# Patient Record
Sex: Female | Born: 2000 | Race: White | Hispanic: No | Marital: Single | Smoking: Never smoker
Health system: Southern US, Community
[De-identification: ages and names within clinical notes are randomized; demographics above are authoritative.]

## PROBLEM LIST (undated history)

## (undated) DIAGNOSIS — J353 Hypertrophy of tonsils with hypertrophy of adenoids: Secondary | ICD-10-CM

## (undated) DIAGNOSIS — Z87448 Personal history of other diseases of urinary system: Secondary | ICD-10-CM

## (undated) DIAGNOSIS — K0889 Other specified disorders of teeth and supporting structures: Secondary | ICD-10-CM

## (undated) DIAGNOSIS — K900 Celiac disease: Secondary | ICD-10-CM

## (undated) HISTORY — DX: Celiac disease: K90.0

## (undated) HISTORY — PX: ADENOIDECTOMY: SUR15

## (undated) HISTORY — PX: TYMPANOSTOMY TUBE PLACEMENT: SHX32

## (undated) HISTORY — PX: TONSILLECTOMY: SUR1361

---
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
## 2021-04-20 HISTORY — PX: KNEE SURGERY: SHX244

## 2021-05-07 ENCOUNTER — Other Ambulatory Visit (HOSPITAL_COMMUNITY): Payer: Self-pay

## 2021-05-07 MED ORDER — PROMETHAZINE HCL 25 MG PO TABS
25.0000 mg | ORAL_TABLET | Freq: Four times a day (QID) | ORAL | 0 refills | Status: DC | PRN
  Filled 2021-05-07: qty 30, 8d supply, fill #0

## 2021-05-07 MED ORDER — OXYCODONE HCL 5 MG PO TABS
5.0000 mg | ORAL_TABLET | Freq: Four times a day (QID) | ORAL | 0 refills | Status: AC | PRN
  Filled 2021-05-07: qty 28, 7d supply, fill #0

## 2021-06-18 ENCOUNTER — Other Ambulatory Visit: Payer: Self-pay | Admitting: Orthopedic Surgery

## 2021-06-18 DIAGNOSIS — G8929 Other chronic pain: Secondary | ICD-10-CM

## 2021-06-19 ENCOUNTER — Other Ambulatory Visit: Payer: Self-pay

## 2021-06-19 ENCOUNTER — Ambulatory Visit
Admission: RE | Admit: 2021-06-19 | Discharge: 2021-06-19 | Disposition: A | Discharge status: Home / Self Care | Payer: Federal, State, Local not specified - PPO | Source: Ambulatory Visit | Attending: Orthopedic Surgery | Admitting: Orthopedic Surgery

## 2021-06-19 DIAGNOSIS — M25561 Pain in right knee: Secondary | ICD-10-CM

## 2021-06-19 DIAGNOSIS — G8929 Other chronic pain: Secondary | ICD-10-CM

## 2021-07-09 ENCOUNTER — Other Ambulatory Visit (HOSPITAL_COMMUNITY): Payer: Self-pay

## 2021-07-09 MED ORDER — ONDANSETRON HCL 4 MG PO TABS
4.0000 mg | ORAL_TABLET | Freq: Three times a day (TID) | ORAL | 0 refills | Status: DC | PRN
  Filled 2021-07-09: qty 30, 5d supply, fill #0

## 2021-07-09 MED ORDER — OXYCODONE HCL 5 MG PO TABS
5.0000 mg | ORAL_TABLET | ORAL | 0 refills | Status: DC | PRN
  Filled 2021-07-09: qty 10, 3d supply, fill #0

## 2021-10-20 DIAGNOSIS — M25462 Effusion, left knee: Secondary | ICD-10-CM | POA: Diagnosis not present

## 2021-10-20 DIAGNOSIS — R262 Difficulty in walking, not elsewhere classified: Secondary | ICD-10-CM | POA: Diagnosis not present

## 2021-10-20 DIAGNOSIS — M25662 Stiffness of left knee, not elsewhere classified: Secondary | ICD-10-CM | POA: Diagnosis not present

## 2021-10-20 DIAGNOSIS — M6281 Muscle weakness (generalized): Secondary | ICD-10-CM | POA: Diagnosis not present

## 2021-10-31 DIAGNOSIS — R262 Difficulty in walking, not elsewhere classified: Secondary | ICD-10-CM | POA: Diagnosis not present

## 2021-10-31 DIAGNOSIS — M25462 Effusion, left knee: Secondary | ICD-10-CM | POA: Diagnosis not present

## 2021-10-31 DIAGNOSIS — M25662 Stiffness of left knee, not elsewhere classified: Secondary | ICD-10-CM | POA: Diagnosis not present

## 2021-10-31 DIAGNOSIS — M6281 Muscle weakness (generalized): Secondary | ICD-10-CM | POA: Diagnosis not present

## 2021-11-17 DIAGNOSIS — M25662 Stiffness of left knee, not elsewhere classified: Secondary | ICD-10-CM | POA: Diagnosis not present

## 2021-11-17 DIAGNOSIS — M6281 Muscle weakness (generalized): Secondary | ICD-10-CM | POA: Diagnosis not present

## 2021-11-17 DIAGNOSIS — M25462 Effusion, left knee: Secondary | ICD-10-CM | POA: Diagnosis not present

## 2021-11-17 DIAGNOSIS — R262 Difficulty in walking, not elsewhere classified: Secondary | ICD-10-CM | POA: Diagnosis not present

## 2021-11-28 DIAGNOSIS — M25462 Effusion, left knee: Secondary | ICD-10-CM | POA: Diagnosis not present

## 2021-11-28 DIAGNOSIS — M6281 Muscle weakness (generalized): Secondary | ICD-10-CM | POA: Diagnosis not present

## 2021-11-28 DIAGNOSIS — R262 Difficulty in walking, not elsewhere classified: Secondary | ICD-10-CM | POA: Diagnosis not present

## 2021-11-28 DIAGNOSIS — M25662 Stiffness of left knee, not elsewhere classified: Secondary | ICD-10-CM | POA: Diagnosis not present

## 2021-12-01 DIAGNOSIS — R262 Difficulty in walking, not elsewhere classified: Secondary | ICD-10-CM | POA: Diagnosis not present

## 2021-12-01 DIAGNOSIS — M25662 Stiffness of left knee, not elsewhere classified: Secondary | ICD-10-CM | POA: Diagnosis not present

## 2021-12-01 DIAGNOSIS — M6281 Muscle weakness (generalized): Secondary | ICD-10-CM | POA: Diagnosis not present

## 2021-12-01 DIAGNOSIS — M25462 Effusion, left knee: Secondary | ICD-10-CM | POA: Diagnosis not present

## 2021-12-02 DIAGNOSIS — Z9889 Other specified postprocedural states: Secondary | ICD-10-CM | POA: Diagnosis not present

## 2021-12-05 ENCOUNTER — Other Ambulatory Visit (HOSPITAL_BASED_OUTPATIENT_CLINIC_OR_DEPARTMENT_OTHER): Payer: Self-pay

## 2021-12-05 ENCOUNTER — Other Ambulatory Visit (HOSPITAL_COMMUNITY): Payer: Self-pay

## 2021-12-05 MED ORDER — ALBUTEROL SULFATE HFA 108 (90 BASE) MCG/ACT IN AERS
INHALATION_SPRAY | RESPIRATORY_TRACT | 4 refills | Status: DC
  Filled 2021-12-05: qty 17, 30d supply, fill #0

## 2021-12-05 MED ORDER — ALBUTEROL SULFATE HFA 108 (90 BASE) MCG/ACT IN AERS
INHALATION_SPRAY | RESPIRATORY_TRACT | 2 refills | Status: DC
  Filled 2021-12-05: qty 18, 34d supply, fill #0

## 2022-09-29 ENCOUNTER — Other Ambulatory Visit (HOSPITAL_BASED_OUTPATIENT_CLINIC_OR_DEPARTMENT_OTHER): Payer: Self-pay

## 2022-09-29 MED ORDER — DEXAMETHASONE 2 MG PO TABS
2.0000 mg | ORAL_TABLET | Freq: Three times a day (TID) | ORAL | 0 refills | Status: DC
  Filled 2022-09-29: qty 9, 3d supply, fill #0

## 2022-10-01 ENCOUNTER — Other Ambulatory Visit (HOSPITAL_BASED_OUTPATIENT_CLINIC_OR_DEPARTMENT_OTHER): Payer: Self-pay

## 2022-10-01 DIAGNOSIS — R5383 Other fatigue: Secondary | ICD-10-CM | POA: Diagnosis not present

## 2022-10-01 DIAGNOSIS — Z0001 Encounter for general adult medical examination with abnormal findings: Secondary | ICD-10-CM | POA: Diagnosis not present

## 2022-10-01 DIAGNOSIS — J45991 Cough variant asthma: Secondary | ICD-10-CM | POA: Diagnosis not present

## 2022-10-01 MED ORDER — ALBUTEROL SULFATE HFA 108 (90 BASE) MCG/ACT IN AERS
2.0000 | INHALATION_SPRAY | RESPIRATORY_TRACT | 2 refills | Status: DC
  Filled 2022-10-01: qty 13.4, 30d supply, fill #0

## 2022-10-02 ENCOUNTER — Other Ambulatory Visit (HOSPITAL_BASED_OUTPATIENT_CLINIC_OR_DEPARTMENT_OTHER): Payer: Self-pay

## 2022-10-02 MED ORDER — ONDANSETRON 8 MG PO TBDP
8.0000 mg | ORAL_TABLET | Freq: Three times a day (TID) | ORAL | 0 refills | Status: DC | PRN
  Filled 2022-10-02: qty 4, 2d supply, fill #0

## 2022-10-02 MED ORDER — OXYCODONE HCL 5 MG PO TABS
5.0000 mg | ORAL_TABLET | ORAL | 0 refills | Status: DC
  Filled 2022-10-02: qty 10, 3d supply, fill #0

## 2022-10-04 ENCOUNTER — Other Ambulatory Visit (HOSPITAL_BASED_OUTPATIENT_CLINIC_OR_DEPARTMENT_OTHER): Payer: Self-pay

## 2022-12-10 ENCOUNTER — Other Ambulatory Visit (HOSPITAL_BASED_OUTPATIENT_CLINIC_OR_DEPARTMENT_OTHER): Payer: Self-pay

## 2022-12-10 MED ORDER — ATROVENT HFA 17 MCG/ACT IN AERS
2.0000 | INHALATION_SPRAY | Freq: Two times a day (BID) | RESPIRATORY_TRACT | 1 refills | Status: AC | PRN
  Filled 2022-12-10: qty 12.9, 30d supply, fill #0

## 2023-02-04 DIAGNOSIS — Z0189 Encounter for other specified special examinations: Secondary | ICD-10-CM | POA: Diagnosis not present

## 2023-04-17 ENCOUNTER — Other Ambulatory Visit (HOSPITAL_BASED_OUTPATIENT_CLINIC_OR_DEPARTMENT_OTHER): Payer: Self-pay

## 2023-04-17 DIAGNOSIS — E049 Nontoxic goiter, unspecified: Secondary | ICD-10-CM | POA: Diagnosis not present

## 2023-04-17 DIAGNOSIS — E559 Vitamin D deficiency, unspecified: Secondary | ICD-10-CM | POA: Diagnosis not present

## 2023-04-17 DIAGNOSIS — R5383 Other fatigue: Secondary | ICD-10-CM | POA: Diagnosis not present

## 2023-04-17 DIAGNOSIS — E569 Vitamin deficiency, unspecified: Secondary | ICD-10-CM | POA: Diagnosis not present

## 2023-04-17 DIAGNOSIS — K900 Celiac disease: Secondary | ICD-10-CM | POA: Diagnosis not present

## 2023-04-17 DIAGNOSIS — L700 Acne vulgaris: Secondary | ICD-10-CM | POA: Diagnosis not present

## 2023-04-17 MED ORDER — TRETINOIN 0.025 % EX CREA
1.0000 | TOPICAL_CREAM | Freq: Every evening | CUTANEOUS | 1 refills | Status: AC
  Filled 2023-04-17: qty 20, 30d supply, fill #0

## 2023-04-17 MED ORDER — CLINDAMYCIN PHOS-BENZOYL PEROX 1-5 % EX GEL
1.0000 | Freq: Every day | CUTANEOUS | 2 refills | Status: DC
  Filled 2023-04-17: qty 50, 50d supply, fill #0

## 2023-04-17 MED ORDER — ALBUTEROL SULFATE HFA 108 (90 BASE) MCG/ACT IN AERS
2.0000 | INHALATION_SPRAY | RESPIRATORY_TRACT | 4 refills | Status: DC
  Filled 2023-04-17: qty 13.4, 30d supply, fill #0

## 2023-04-17 MED ORDER — ATROVENT HFA 17 MCG/ACT IN AERS
2.0000 | INHALATION_SPRAY | Freq: Two times a day (BID) | RESPIRATORY_TRACT | 1 refills | Status: AC
  Filled 2023-04-17 – 2023-10-31 (×2): qty 12.9, 30d supply, fill #0

## 2023-04-19 ENCOUNTER — Other Ambulatory Visit: Payer: Self-pay

## 2023-04-19 ENCOUNTER — Other Ambulatory Visit (HOSPITAL_BASED_OUTPATIENT_CLINIC_OR_DEPARTMENT_OTHER): Payer: Self-pay

## 2023-05-24 DIAGNOSIS — R221 Localized swelling, mass and lump, neck: Secondary | ICD-10-CM | POA: Diagnosis not present

## 2023-06-28 IMAGING — MR MR KNEE*R* W/O CM
6 of 9 series · 22 of 40 positions shown · non-contrast
Comparison: None.

CLINICAL DATA: Right knee pain playing basketball 4 months ago.

EXAM:
MRI OF THE RIGHT KNEE WITHOUT CONTRAST
TECHNIQUE: Multiplanar, multisequence MR imaging of the knee was performed. No
intravenous contrast was administered.

[Series 3: STIR · axial · 4.0mm · 0.29mm/px · z∈[-57,-27]mm · 2 of 33 slices shown]
[im 1/33]
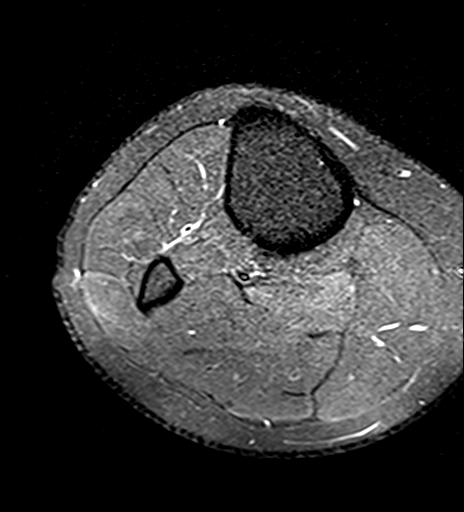
[im 7/33]
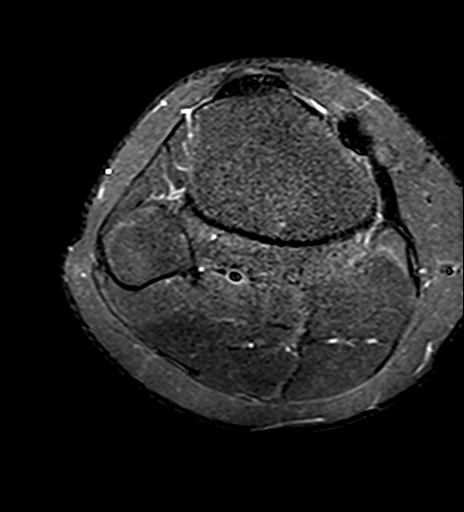

[Series 4: PD · axial · 4.0mm · 0.59mm/px · z∈[-60,+100]mm · 5 of 33 slices shown]
[im 1/33]
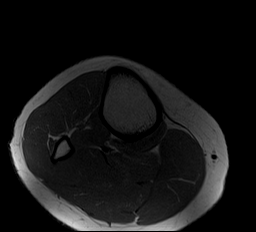
[im 9/33]
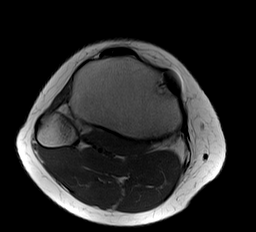
[im 17/33]
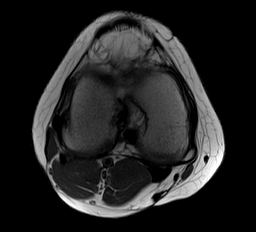
[im 25/33]
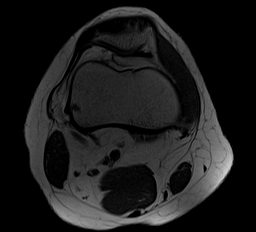
[im 33/33]
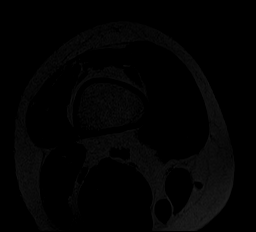

[Series 5: T1 · coronal · 4.0mm · 0.31mm/px · 4 of 24 slices shown]
[im 1/24]
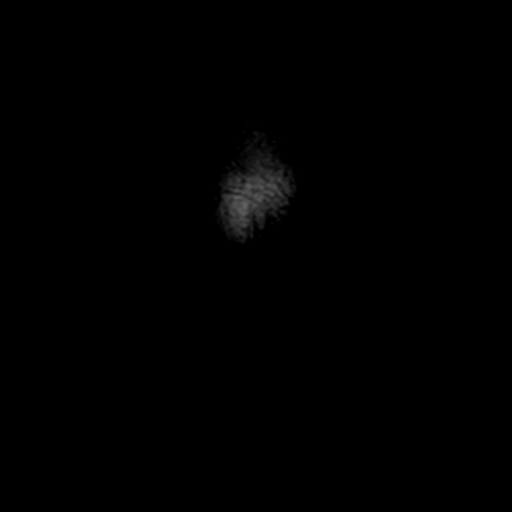
[im 8/24]
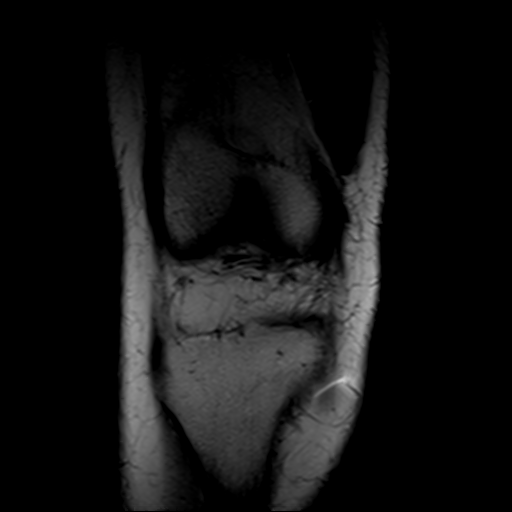
[im 16/24]
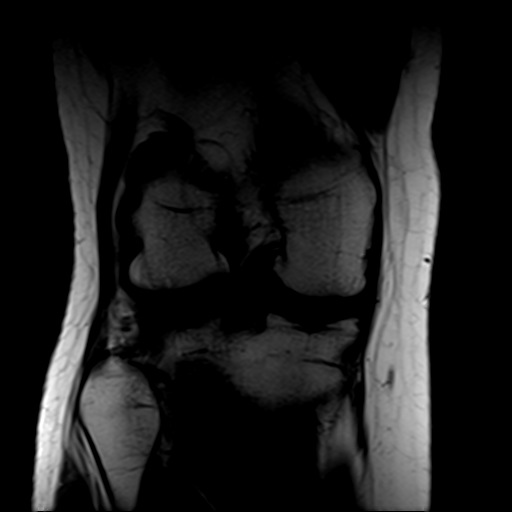
[im 24/24]
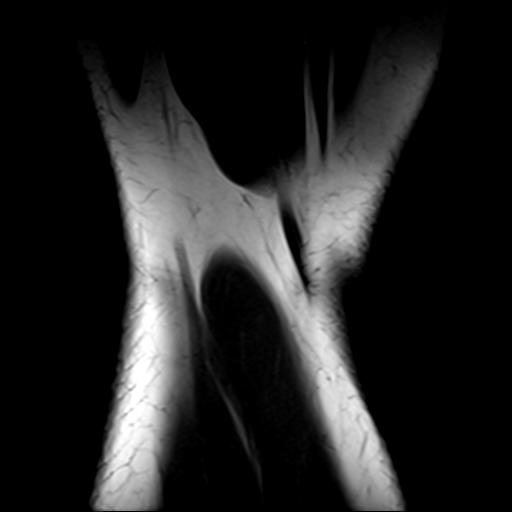

[Series 6: PD fat-sat · coronal · 4.0mm · 0.31mm/px · 4 of 24 slices shown (1 of 3)]
[im 1/24]
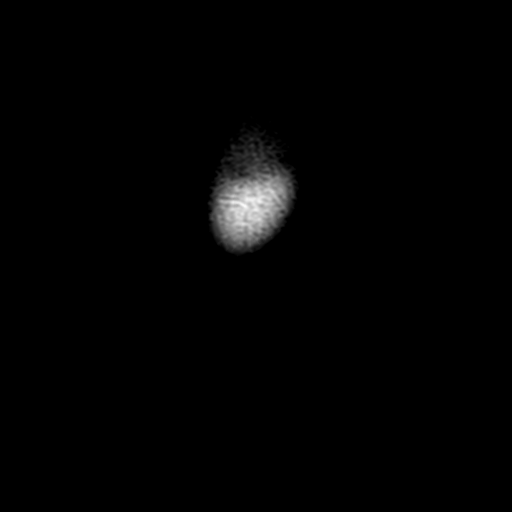
[im 8/24]
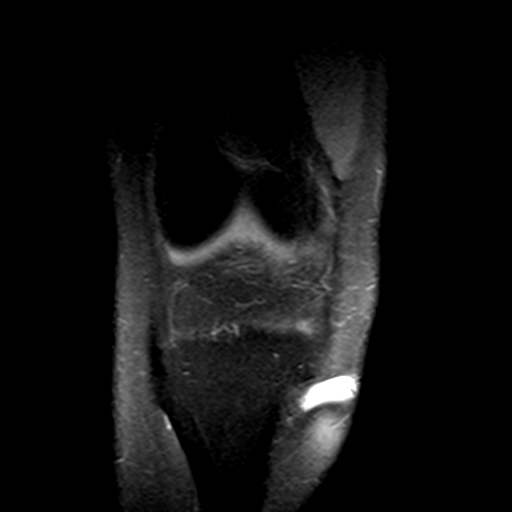
[im 16/24]
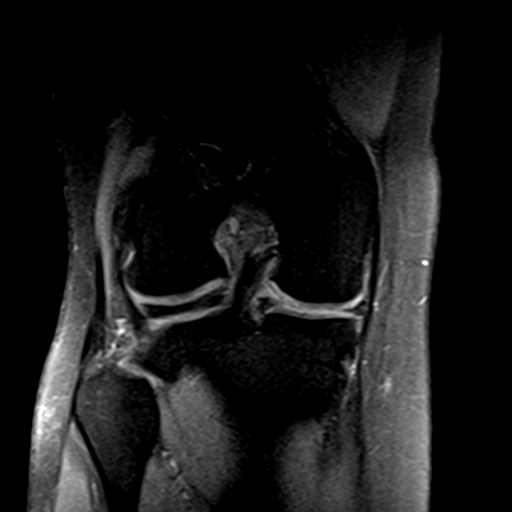
[im 24/24]
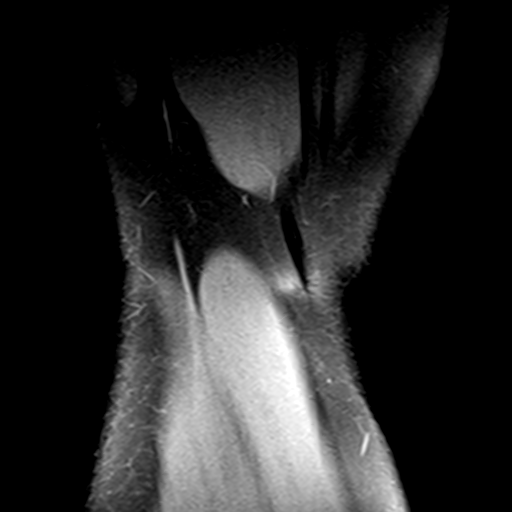

[Series 8: PD fat-sat · sagittal · 3.0mm · 0.31mm/px · 5 of 31 slices shown (2 of 3)]
[im 1/31]
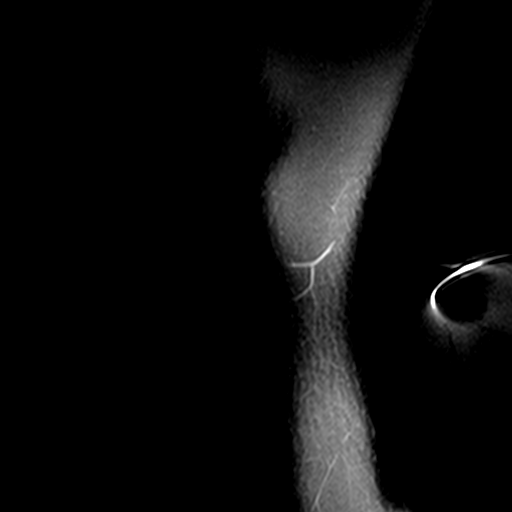
[im 8/31]
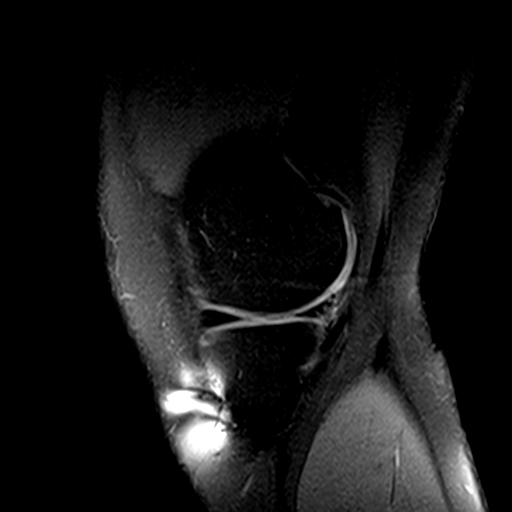
[im 16/31]
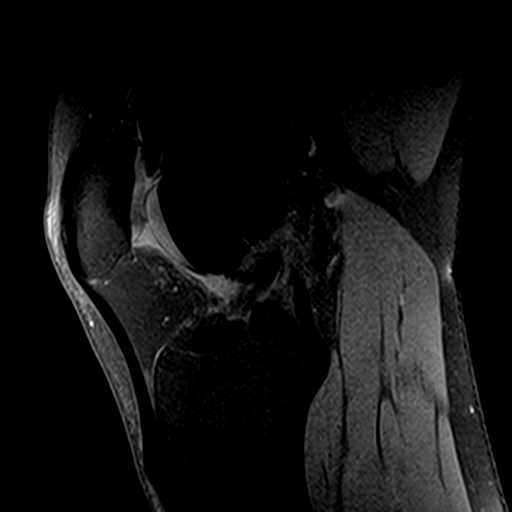
[im 23/31]
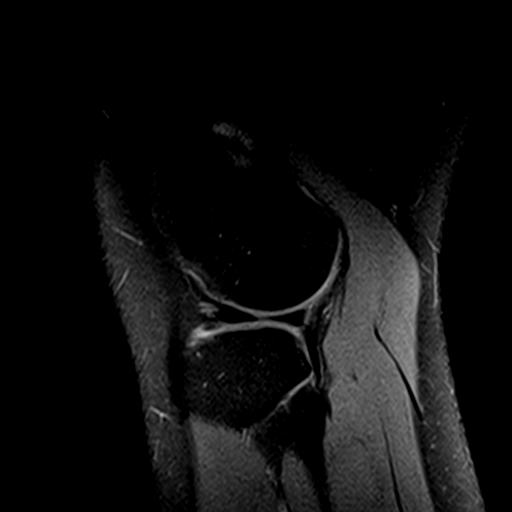
[im 31/31]
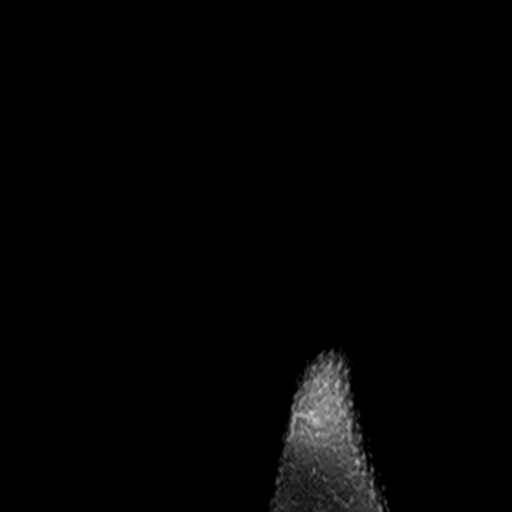

[Series 11: PD fat-sat · oblique · 2.3mm · 0.29mm/px · 2 of 13 slices shown (3 of 3)]
[im 1/13]
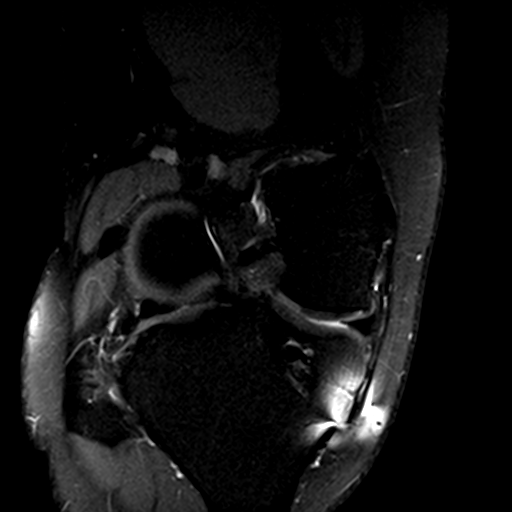
[im 13/13]
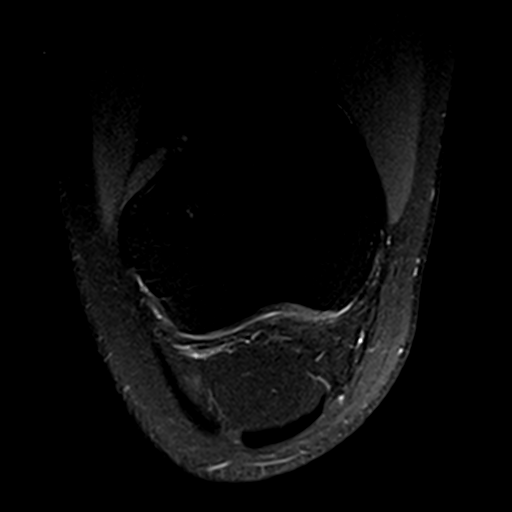

[22 of 40 positions shown; findings below may reference images not displayed]

FINDINGS: MENISCI

Medial: Oblique linear signal abnormality in the posterior horn of
the medial meniscus which may reflect postsurgical changes from
prior meniscal repair versus a tear. Attenuation of the body of the
medial meniscus likely reflecting prior partial meniscectomy.

Lateral: Oblique tear of the posterior horn-body junction of the
lateral meniscus.

LIGAMENTS

Cruciates: Prior ACL repair with the ACL graft intact.  Intact PCL.

Collaterals: Medial collateral ligament is intact. Lateral
collateral ligament complex is intact.

CARTILAGE

Patellofemoral:  Mild cartilage fissuring of the patellar apex.

Medial: Mild partial-thickness cartilage loss of the medial
femorotibial compartment.

Lateral:  No chondral defect.

JOINT: No joint effusion. Normal Suvituulia Skriko. No plical
thickening.

POPLITEAL FOSSA: Popliteus tendon is intact. No Baker's cyst.

EXTENSOR MECHANISM: Intact quadriceps tendon. Intact patellar
tendon. Intact lateral patellar retinaculum. Intact medial patellar
retinaculum. Intact MPFL.

BONES: No aggressive osseous lesion. No fracture or dislocation.

Other: No fluid collection or hematoma. Muscles are normal.
IMPRESSION: 1. Oblique linear signal abnormality in the posterior horn of the
medial meniscus which may reflect postsurgical changes from prior
meniscal repair versus a tear. Attenuation of the body of the medial
meniscus likely reflecting prior partial meniscectomy.
2. Oblique tear of the posterior horn-body junction of the lateral
meniscus.
3. Prior ACL repair with the ACL graft intact.

## 2023-07-01 ENCOUNTER — Encounter: Payer: Self-pay | Admitting: Gastroenterology

## 2023-08-02 ENCOUNTER — Other Ambulatory Visit (HOSPITAL_BASED_OUTPATIENT_CLINIC_OR_DEPARTMENT_OTHER): Payer: Self-pay

## 2023-08-31 DIAGNOSIS — Z124 Encounter for screening for malignant neoplasm of cervix: Secondary | ICD-10-CM | POA: Diagnosis not present

## 2023-08-31 DIAGNOSIS — R221 Localized swelling, mass and lump, neck: Secondary | ICD-10-CM | POA: Diagnosis not present

## 2023-08-31 DIAGNOSIS — J383 Other diseases of vocal cords: Secondary | ICD-10-CM | POA: Diagnosis not present

## 2023-08-31 DIAGNOSIS — M26622 Arthralgia of left temporomandibular joint: Secondary | ICD-10-CM | POA: Diagnosis not present

## 2023-08-31 DIAGNOSIS — Z113 Encounter for screening for infections with a predominantly sexual mode of transmission: Secondary | ICD-10-CM | POA: Diagnosis not present

## 2023-08-31 DIAGNOSIS — Z01419 Encounter for gynecological examination (general) (routine) without abnormal findings: Secondary | ICD-10-CM | POA: Diagnosis not present

## 2023-09-01 ENCOUNTER — Encounter: Payer: Self-pay | Admitting: Gastroenterology

## 2023-09-01 ENCOUNTER — Ambulatory Visit: Payer: PRIVATE HEALTH INSURANCE | Admitting: Gastroenterology

## 2023-09-01 ENCOUNTER — Other Ambulatory Visit (INDEPENDENT_AMBULATORY_CARE_PROVIDER_SITE_OTHER)

## 2023-09-01 ENCOUNTER — Other Ambulatory Visit (HOSPITAL_BASED_OUTPATIENT_CLINIC_OR_DEPARTMENT_OTHER): Payer: Self-pay

## 2023-09-01 VITALS — BP 92/58 | HR 79 | Ht 67.0 in | Wt 153.0 lb

## 2023-09-01 DIAGNOSIS — K8681 Exocrine pancreatic insufficiency: Secondary | ICD-10-CM

## 2023-09-01 DIAGNOSIS — K900 Celiac disease: Secondary | ICD-10-CM | POA: Insufficient documentation

## 2023-09-01 DIAGNOSIS — R09A2 Foreign body sensation, throat: Secondary | ICD-10-CM

## 2023-09-01 LAB — IBC + FERRITIN
Ferritin: 24.1 ng/mL (ref 10.0–291.0)
Iron: 76 ug/dL (ref 42–145)
Saturation Ratios: 18 % — ABNORMAL LOW (ref 20.0–50.0)
TIBC: 422.8 ug/dL (ref 250.0–450.0)
Transferrin: 302 mg/dL (ref 212.0–360.0)

## 2023-09-01 LAB — B12 AND FOLATE PANEL
Folate: >25.2 ng/mL (ref >5.9)
Vitamin B-12: 271 pg/mL (ref 211–911)

## 2023-09-01 LAB — VITAMIN D 25 HYDROXY (VIT D DEFICIENCY, FRACTURES): VITD: 50.23 ng/mL (ref 30.00–100.00)

## 2023-09-01 MED ORDER — FAMOTIDINE 20 MG PO TABS
20.0000 mg | ORAL_TABLET | Freq: Two times a day (BID) | ORAL | 3 refills | Status: AC
  Filled 2023-09-01: qty 180, 90d supply, fill #0

## 2023-09-01 NOTE — Progress Notes (Signed)
 09/01/2023 Marie Odom [id number] 10-Dec-2000   HISTORY OF PRESENT ILLNESS: This is a pleasant 23 year old female who is new to our office.  She is here today with her mom to establish adult GI care.  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  She will still be under her parents' insurance so they wanted to her to have her care locally.  She started seeing GI around 2018 at which time she was diagnosed with celiac disease.  She started out at [facility] and then they eventually got a second opinion at [facility].  She has had 3 EGDs since 2018, looks like 2018, 2019, 2021.  Celiac labs were abnormal, but her IgA levels were low as well.  It looks like her initial duodenal biopsies in 2018 showed some villous blunting.  She has been very strict with her gluten-free diet.  She has had lifelong complaints of stomach issues, which all along were likely due to celiac disease.  Still has some pain, occasional diarrhea, and nausea on and off, but not nearly to the degree as it had been in the past.  Never had a colonoscopy.  Does also report globus sensation that has been going on for a while, years.  She denies any overt heartburn or reflux.  She does have a lot of environmental allergies.  She is going to have a thyroid  ultrasound just to check that as well.  No dysphagia.   Past Medical History:  Diagnosis Date   Celiac disease    History of renal failure 05/21/2013   secondary to medication   Tonsillar and adenoid hypertrophy 10/18/2013   occasionally snores during sleep, mother denies apnea   Tooth loose 11/06/2013   left upper   Past Surgical History:  Procedure Laterality Date   ADENOIDECTOMY     KNEE SURGERY Right 2023   ACL   KNEE SURGERY Left 2019   RENAL BIOPSY  06/12/2013   TONSILLECTOMY     TONSILLECTOMY AND ADENOIDECTOMY Bilateral 11/15/2013   Procedure: BILATERAL TONSILLECTOMY AND ADENOIDECTOMY;  Surgeon:  Ammon Bales, MD;  Location: [facility];  Service: ENT;  Laterality: Bilateral;   TYMPANOSTOMY TUBE PLACEMENT      reports that she has never smoked. She has never used smokeless tobacco. She reports current alcohol use. She reports that she does not use drugs. family history includes Anesthesia problems in her mother; Asthma in her brother; Heart disease (age of onset: 63) in her maternal grandfather. Allergies  Allergen Reactions   Gluten Meal Other (See Comments)    Dx of celiac disease, GI upset   Ibuprofen Other (See Comments)    ACUTE RENAL FAILURE   Penicillins Other (See Comments)    ACUTE RENAL FAILURE      Outpatient Encounter Medications as of 09/01/2023  Medication Sig   albuterol  (VENTOLIN  HFA) 108 (90 Base) MCG/ACT inhaler Inhale 2 puffs into the lungs every 4-6 hours as needed.   cetirizine (ZYRTEC) 10 MG tablet Take 10 mg by mouth daily.   fluticasone  (FLONASE  ALLERGY RELIEF) 50 MCG/ACT nasal spray Place 1 spray into both nostrils daily as needed   ipratropium (ATROVENT  HFA) 17 MCG/ACT inhaler Inhale 2 puffs into the lungs 1 (one) to 2 (two) times daily as needed.   ipratropium (ATROVENT  HFA) 17 MCG/ACT inhaler Inhale 2 puffs into the lungs 2 (two) times daily. (Patient taking differently: Inhale 2 puffs into the lungs as needed.)  tretinoin  (RETIN-A ) 0.025 % cream 1 Applicatorful applied to skin every evening   albuterol  (VENTOLIN  HFA) 108 (90 Base) MCG/ACT inhaler INHALE 2 PUFFS BY MOUTH EVERY 4 TO 6 HOURS AS NEEDED   oxyCODONE  (OXY IR/ROXICODONE ) 5 MG immediate release tablet Take 1-2 tablets (5-10 mg total) by mouth every 6 (six) hours as needed for up to 5 days   [DISCONTINUED] albuterol  (VENTOLIN  HFA) 108 (90 Base) MCG/ACT inhaler Inhale 2 Puffs every 4 to 6 hours as needed   [DISCONTINUED] albuterol  (VENTOLIN  HFA) 108 (90 Base) MCG/ACT inhaler 2 Puffs by metered dose inhaler every 4 to 6 hours as needed for cough/wheeze   [DISCONTINUED] albuterol   (VENTOLIN  HFA) 108 (90 Base) MCG/ACT inhaler Inhale 2 puffs into the lungs every 4 to 6 hours as needed for cough/wheeze.   [DISCONTINUED] albuterol  (VENTOLIN  HFA) 108 (90 Base) MCG/ACT inhaler Inhale 2 puffs into the lungs every 4-6 hours as needed   [DISCONTINUED] clindamycin -benzoyl peroxide  (BENZACLIN) gel Apply 1 Application topically daily to skin.   [DISCONTINUED] dexamethasone  (DECADRON ) 2 MG tablet Take 1 tablet (2 mg total) by mouth every 8 (eight) hours   [DISCONTINUED] famotidine  (PEPCID ) 40 MG tablet Take 1 tablet (40 mg total) by mouth daily as needed   [DISCONTINUED] HYDROcodone -acetaminophen  (HYCET) 7.5-325 mg/15 ml solution Take 5 mLs by mouth every 4 (four) hours as needed for moderate pain.   [DISCONTINUED] ondansetron  (ZOFRAN ) 4 MG tablet Take 1-2 tablets (4-8 mg total) by mouth every 8 (eight) hours as needed for nausea/vomiting   [DISCONTINUED] ondansetron  (ZOFRAN -ODT) 8 MG disintegrating tablet Take 1 tablet (8 mg total) by mouth every 8 (eight) hours as needed for nausea.   [DISCONTINUED] oxyCODONE  (OXY IR/ROXICODONE ) 5 MG immediate release tablet Take 1 tablet (5 mg total) by mouth every 4 (four) hours as needed. This is for breakthrough pain after surgery.  Please take as needed only   [DISCONTINUED] oxyCODONE  (OXY IR/ROXICODONE ) 5 MG immediate release tablet Take 1 tablet (5 mg total) by mouth every 4-6 hours as needed for severe pain.   [DISCONTINUED] polyethylene glycol powder (GLYCOLAX /MIRALAX ) powder Take 1 Container by mouth daily. Take 1 container daily as long as she is taking the pain medication.   [DISCONTINUED] promethazine  (PHENERGAN ) 25 MG tablet Take 1 tablet (25 mg total) by mouth every 6 (six) hours as needed for nausea (Patient not taking: Reported on 09/01/2023)   No facility-administered encounter medications on file as of 09/01/2023.     REVIEW OF SYSTEMS  : All other systems reviewed and negative except where noted in the History of Present  Illness.   PHYSICAL EXAM: BP (!) 80/68   Pulse 79   Ht 5\' 7"  (1.702 m)   Wt 153 lb (69.4 kg)   LMP 08/18/2023 (Approximate)   SpO2 98%   BMI 23.96 kg/m  General: Well developed white female in no acute distress Head: Normocephalic and atraumatic Eyes:  Sclerae anicteric, conjunctiva pink. Ears: Normal auditory acuity Lungs: Clear throughout to auscultation; no W/R/R. Heart: Regular rate and rhythm; no M/R/G. Abdomen: Soft, non-distended.  BS present.  Non-tender. Musculoskeletal: Symmetrical with no gross deformities  Skin: No lesions on visible extremities Extremities: No edema  Neurological: Alert oriented x 4, grossly non-focal Psychological:  Alert and cooperative. Normal mood and affect  ASSESSMENT AND PLAN: *Celiac disease: Diagnosed in 2018.  Very strict about her diet.  Wanting to establish care with adult GI.  Will plan to check celiac titers, iron studies, B12 and folate levels, vitamin D levels, zinc  and copper levels, and vitamin B6 levels. *Globus sensation: Denies overt heartburn and reflux.  Has a lot of allergies, environmental allergy issues.  She is going to be having a thyroid  ultrasound just to check that.  Suggested maybe she try famotidine  20 mg twice daily for a while to see if that helps as it could help if it is reflux and/or if its allergy related.  Prescription sent to pharmacy.  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   CC:  Awanda Lennert, MD

## 2023-09-01 NOTE — Patient Instructions (Addendum)
 Your provider has requested that you go to the basement level for lab work before leaving today. Press "B" on the elevator. The lab is located at the first door on the left as you exit the elevator.  We have sent the following medications to your pharmacy for you to pick up at your convenience: Famotidine  20 mg twice daily.   _______________________________________________________  If your blood pressure at your visit was 140/90 or greater, please contact your primary care physician to follow up on this.  _______________________________________________________  If you are age 67 or older, your body mass index should be between 23-30. Your Body mass index is 23.96 kg/m. If this is out of the aforementioned range listed, please consider follow up with your Primary Care Provider.  If you are age 84 or younger, your body mass index should be between 19-25. Your Body mass index is 23.96 kg/m. If this is out of the aformentioned range listed, please consider follow up with your Primary Care Provider.   ________________________________________________________  The [facility] providers would like to encourage you to use MYCHART to communicate with providers for non-urgent requests or questions.  Due to long hold times on the telephone, sending your provider a message by [facility] may be a faster and more efficient way to get a response.  Please allow 48 business hours for a response.  Please remember that this is for non-urgent requests.  _______________________________________________________

## 2023-09-02 ENCOUNTER — Encounter (HOSPITAL_BASED_OUTPATIENT_CLINIC_OR_DEPARTMENT_OTHER): Payer: Self-pay | Admitting: Pulmonary Disease

## 2023-09-02 ENCOUNTER — Ambulatory Visit (HOSPITAL_BASED_OUTPATIENT_CLINIC_OR_DEPARTMENT_OTHER): Payer: PRIVATE HEALTH INSURANCE | Admitting: Pulmonary Disease

## 2023-09-02 VITALS — BP 100/67 | HR 67 | Ht 67.0 in | Wt 153.0 lb

## 2023-09-02 DIAGNOSIS — J302 Other seasonal allergic rhinitis: Secondary | ICD-10-CM

## 2023-09-02 DIAGNOSIS — J383 Other diseases of vocal cords: Secondary | ICD-10-CM

## 2023-09-02 NOTE — Patient Instructions (Signed)
 YOUR PLAN:  -VOCAL CORD DYSFUNCTION: Vocal cord dysfunction is a condition where the vocal cords do not open correctly, causing breathing difficulties during activities like exercise. Your symptoms are consistent with this condition, and previous treatments with inhalers have not been effective. We recommend trying biofeedback techniques, such as abdominal breathing, to help manage your symptoms. We also provided reading material on vocal cord dysfunction and advised against relying on inhalers for this condition. If you wish, we can refer you to Dr. Kimberly Penna, an ENT specialist at [facility], for further evaluation. If your symptoms worsen, consider spirometry post-exercise.

## 2023-09-02 NOTE — Progress Notes (Signed)
 Subjective:    Patient ID: Marie Odom, female    DOB: 2000/08/08, 23 y.o.   MRN: [id number]  HPI  Marie Odom is a 23 year old female with vocal cord dysfunction who presents with breathing difficulties during exercise. She is accompanied by her mother. She was referred for evaluation of vocal cord dysfunction.  She experiences inspiratory difficulty during high-intensity activities, which resolves after a few minutes of rest. These episodes have persisted despite using inhalers like albuterol  and Atrovent . Previous spirometry was normal. A pulmonologist suggested vocal cord dysfunction, and she has seen a speech therapist, though the focus was on her voice rather than breathing techniques.  Her symptoms are less frequent with improved cardiovascular fitness. She transitioned from basketball to hiking but still experiences similar episodes. On non-strenuous days, she does not have breathing difficulties.  She has allergies to grass and environmental triggers, which worsen her symptoms. She takes Zyrtec year-round and occasionally uses Flonase . Strong scents cause discomfort. Her symptoms began around 2017, confirmed by allergy testing showing significant reactions. Thyroid  testing planned by ENT  CXR 10/2019 clear  Significant tests/ events reviewed  Spirometry 10/2017 ratio 93 FEV1 96%, FVC 91%  Past Medical History:  Diagnosis Date   Celiac disease    History of renal failure 05/21/2013   secondary to medication   Tonsillar and adenoid hypertrophy 10/18/2013   occasionally snores during sleep, mother denies apnea   Tooth loose 11/06/2013   left upper   Past Surgical History:  Procedure Laterality Date   ADENOIDECTOMY     KNEE SURGERY Right 2023   ACL   KNEE SURGERY Left 2019   RENAL BIOPSY  06/12/2013   TONSILLECTOMY     TONSILLECTOMY AND ADENOIDECTOMY Bilateral 11/15/2013   Procedure: BILATERAL TONSILLECTOMY AND ADENOIDECTOMY;  Surgeon: Ammon Bales, MD;  Location:  [facility];  Service: ENT;  Laterality: Bilateral;   TYMPANOSTOMY TUBE PLACEMENT     Allergies  Allergen Reactions   Gluten Meal Other (See Comments)    Dx of celiac disease, GI upset   Ibuprofen Other (See Comments)    ACUTE RENAL FAILURE   Penicillins Other (See Comments)    ACUTE RENAL FAILURE    Social History   Socioeconomic History   Marital status: Single    Spouse name: Not on file   Number of children: Not on file   Years of education: Not on file   Highest education level: Not on file  Occupational History   Not on file  Tobacco Use   Smoking status: Never   Smokeless tobacco: Never  Vaping Use   Vaping status: Never Used  Substance and Sexual Activity   Alcohol use: Yes    Comment: occ   Drug use: No   Sexual activity: Not on file  Other Topics Concern   Not on file  Social History Narrative   Lives with Mom, Dad and 2 brothers   Social Drivers of Corporate investment banker Strain: Not on file  Food Insecurity: Not on file  Transportation Needs: Not on file  Physical Activity: Not on file  Stress: Not on file  Social Connections: Not on file  Intimate Partner Violence: Not on file    Family History  Problem Relation Age of Onset   Anesthesia problems Mother        post-op nausea   Asthma Brother    Heart disease Maternal Grandfather 57       MI   Colon cancer  Neg Hx    Esophageal cancer Neg Hx    Stomach cancer Neg Hx      Review of Systems Constitutional: negative for anorexia, fevers and sweats  Eyes: negative for irritation, redness and visual disturbance  Ears, nose, mouth, throat, and face: negative for earaches, epistaxis, nasal congestion and sore throat  Respiratory: negative for cough, dyspnea on exertion, sputum and wheezing  Cardiovascular: negative for chest pain, dyspnea, lower extremity edema, orthopnea, palpitations and syncope  Gastrointestinal: negative for abdominal pain, constipation, diarrhea, melena,  nausea and vomiting  Genitourinary:negative for dysuria, frequency and hematuria  Hematologic/lymphatic: negative for bleeding, easy bruising and lymphadenopathy  Musculoskeletal:negative for arthralgias, muscle weakness and stiff joints  Neurological: negative for coordination problems, gait problems, headaches and weakness  Endocrine: negative for diabetic symptoms including polydipsia, polyuria and weight loss     Objective:   Physical Exam  Gen. Pleasant, well-nourished, in no distress, normal affect ENT - no pallor,icterus, no post nasal drip Neck: No JVD, no thyromegaly, no carotid bruits Lungs: no use of accessory muscles, no dullness to percussion, clear without rales or rhonchi  Cardiovascular: Rhythm regular, heart sounds  normal, no murmurs or gallops, no peripheral edema Abdomen: soft and non-tender, no hepatosplenomegaly, BS normal. Musculoskeletal: No deformities, no cyanosis or clubbing Neuro:  alert, non focal       Assessment & Plan:    Vocal cord dysfunction Chronic vocal cord dysfunction with symptoms primarily during peak exercise, such as basketball and hiking, consistent with classic presentation. Spirometry in 2019 was normal. No evidence of asthma as symptoms do not occur with lesser exercise. Previous treatments with inhalers, including albuterol  and Atrovent , were ineffective. Speech therapy was not beneficial. No anatomical abnormalities expected on ENT examination. Condition is self-limiting and may improve with time. Biofeedback techniques, such as abdominal breathing, may help manage symptoms. No anxiety component noted. Knowledge of the condition and its self-limiting nature is crucial for management. - Provide reading material on vocal cord dysfunction. - Advise against reliance on inhalers for this condition. - Recommend biofeedback techniques, specifically abdominal breathing, to manage symptoms. - Offer referral to Dr. Kimberly Penna, ENT at [facility],  for further evaluation if desired. - Consider spirometry post-exercise if symptoms worsen.  Allergic rhinitis Chronic allergic rhinitis with significant sensitivity to grass and other allergens. Symptoms include reactions to outdoor allergens and strong scents. Managed with Zyrtec year-round and occasional Flonase  use. No indication that allergies contribute to vocal cord dysfunction.  Clearance for school work provided, appears free of communicable TB

## 2023-09-02 NOTE — Addendum Note (Signed)
 Addended by: Cain Castillo on: 09/02/2023 04:16 PM   Modules accepted: Orders

## 2023-09-03 ENCOUNTER — Other Ambulatory Visit

## 2023-09-03 ENCOUNTER — Telehealth: Payer: Self-pay | Admitting: *Deleted

## 2023-09-03 DIAGNOSIS — R09A2 Foreign body sensation, throat: Secondary | ICD-10-CM

## 2023-09-03 DIAGNOSIS — K900 Celiac disease: Secondary | ICD-10-CM

## 2023-09-03 NOTE — Progress Notes (Signed)
 ____________________________________________________________  Attending physician addendum:  Thank you for sending this case to me. I have reviewed the entire note and agree with the plan.  Looks like last seen [facility] in 2020 - they felt confident of the Dx based on duodenal Bx results,  Lorella Roles, MD  ____________________________________________________________

## 2023-09-03 NOTE — Telephone Encounter (Signed)
-----   Message from Weston D. Zehr sent at 09/02/2023  4:48 PM EDT ----- Patient to come back for serum copper level.

## 2023-09-03 NOTE — Telephone Encounter (Signed)
 Spoke with patient she will stop by the for the blood draw

## 2023-09-05 LAB — ZINC: Zinc: 69 ug/dL (ref 60–130)

## 2023-09-05 LAB — TISSUE TRANSGLUTAMINASE ABS,IGG,IGA
(tTG) Ab, IgA: 2.8 U/mL
(tTG) Ab, IgG: <1 U/mL

## 2023-09-05 LAB — VITAMIN B6: Vitamin B6: 30.7 ng/mL — ABNORMAL HIGH (ref 2.1–21.7)

## 2023-09-05 LAB — IGA: Immunoglobulin A: 101 mg/dL (ref 47–310)

## 2023-09-05 LAB — CERULOPLASMIN: Ceruloplasmin: 21 mg/dL (ref 14–48)

## 2023-09-07 ENCOUNTER — Ambulatory Visit: Payer: Self-pay | Admitting: Gastroenterology

## 2023-09-07 LAB — COPPER, SERUM: Copper: 103 ug/dL (ref 70–175)

## 2023-09-20 DIAGNOSIS — R221 Localized swelling, mass and lump, neck: Secondary | ICD-10-CM | POA: Diagnosis not present

## 2023-10-28 DIAGNOSIS — D2262 Melanocytic nevi of left upper limb, including shoulder: Secondary | ICD-10-CM | POA: Diagnosis not present

## 2023-10-28 DIAGNOSIS — L700 Acne vulgaris: Secondary | ICD-10-CM | POA: Diagnosis not present

## 2023-10-28 DIAGNOSIS — L858 Other specified epidermal thickening: Secondary | ICD-10-CM | POA: Diagnosis not present

## 2023-10-28 DIAGNOSIS — L821 Other seborrheic keratosis: Secondary | ICD-10-CM | POA: Diagnosis not present

## 2023-10-29 ENCOUNTER — Other Ambulatory Visit (HOSPITAL_BASED_OUTPATIENT_CLINIC_OR_DEPARTMENT_OTHER): Payer: Self-pay

## 2023-10-29 MED ORDER — TRETINOIN 0.025 % EX CREA
TOPICAL_CREAM | CUTANEOUS | 11 refills | Status: AC
  Filled 2023-10-29: qty 20, 30d supply, fill #0

## 2023-11-01 ENCOUNTER — Other Ambulatory Visit (HOSPITAL_BASED_OUTPATIENT_CLINIC_OR_DEPARTMENT_OTHER): Payer: Self-pay

## 2024-04-11 ENCOUNTER — Other Ambulatory Visit (HOSPITAL_BASED_OUTPATIENT_CLINIC_OR_DEPARTMENT_OTHER): Payer: Self-pay

## 2024-04-11 MED ORDER — FLUZONE 0.5 ML IM SUSY
0.5000 mL | PREFILLED_SYRINGE | Freq: Once | INTRAMUSCULAR | 0 refills | Status: AC
  Filled 2024-04-11: qty 0.5, 1d supply, fill #0
